# Patient Record
Sex: Female | Born: 1945 | Race: White | Hispanic: No | Marital: Married
Health system: Midwestern US, Community
[De-identification: ages and names within clinical notes are randomized; demographics above are authoritative.]

## PROBLEM LIST (undated history)

## (undated) DIAGNOSIS — Z923 Personal history of irradiation: Secondary | ICD-10-CM

## (undated) HISTORY — PX: BREAST LUMPECTOMY: SHX2

## (undated) HISTORY — PX: BREAST BIOPSY: SHX20

---

## 1997-07-20 HISTORY — PX: BREAST LUMPECTOMY: SHX2

## 1998-03-26 ENCOUNTER — Other Ambulatory Visit: Admission: RE | Admit: 1998-03-26 | Discharge: 1998-03-26 | Payer: Self-pay | Admitting: General Surgery

## 1998-04-02 ENCOUNTER — Ambulatory Visit (HOSPITAL_COMMUNITY): Admission: RE | Admit: 1998-04-02 | Discharge: 1998-04-02 | Payer: Self-pay | Admitting: General Surgery

## 1998-04-02 ENCOUNTER — Encounter: Payer: Self-pay | Admitting: General Surgery

## 1998-05-03 ENCOUNTER — Encounter: Admission: RE | Admit: 1998-05-03 | Discharge: 1998-08-01 | Payer: Self-pay | Admitting: Hematology & Oncology

## 1998-09-20 ENCOUNTER — Ambulatory Visit (HOSPITAL_COMMUNITY): Admission: RE | Admit: 1998-09-20 | Discharge: 1998-09-20 | Payer: Self-pay | Admitting: Surgery

## 1998-11-04 ENCOUNTER — Encounter: Payer: Self-pay | Admitting: Oncology

## 1998-11-04 ENCOUNTER — Ambulatory Visit (HOSPITAL_COMMUNITY): Admission: RE | Admit: 1998-11-04 | Discharge: 1998-11-04 | Payer: Self-pay | Admitting: Oncology

## 1998-11-11 ENCOUNTER — Ambulatory Visit (HOSPITAL_COMMUNITY): Admission: RE | Admit: 1998-11-11 | Discharge: 1998-11-11 | Payer: Self-pay | Admitting: Oncology

## 1998-11-11 ENCOUNTER — Encounter: Payer: Self-pay | Admitting: Oncology

## 1999-05-30 ENCOUNTER — Encounter: Admission: RE | Admit: 1999-05-30 | Discharge: 1999-05-30 | Payer: Self-pay | Admitting: Oncology

## 1999-05-30 ENCOUNTER — Encounter: Payer: Self-pay | Admitting: Oncology

## 1999-11-07 ENCOUNTER — Encounter: Admission: RE | Admit: 1999-11-07 | Discharge: 1999-11-07 | Payer: Self-pay | Admitting: Oncology

## 1999-11-07 ENCOUNTER — Encounter: Payer: Self-pay | Admitting: Oncology

## 1999-11-17 ENCOUNTER — Encounter: Payer: Self-pay | Admitting: Oncology

## 1999-11-17 ENCOUNTER — Encounter: Admission: RE | Admit: 1999-11-17 | Discharge: 1999-11-17 | Payer: Self-pay | Admitting: Oncology

## 1999-11-17 ENCOUNTER — Encounter (INDEPENDENT_AMBULATORY_CARE_PROVIDER_SITE_OTHER): Payer: Self-pay | Admitting: *Deleted

## 1999-11-17 ENCOUNTER — Other Ambulatory Visit: Admission: RE | Admit: 1999-11-17 | Discharge: 1999-11-17 | Payer: Self-pay | Admitting: Oncology

## 2000-05-14 ENCOUNTER — Encounter: Admission: RE | Admit: 2000-05-14 | Discharge: 2000-05-14 | Payer: Self-pay | Admitting: Oncology

## 2000-05-14 ENCOUNTER — Encounter: Payer: Self-pay | Admitting: Oncology

## 2000-10-08 ENCOUNTER — Encounter: Admission: RE | Admit: 2000-10-08 | Discharge: 2000-10-08 | Payer: Self-pay | Admitting: General Surgery

## 2000-10-08 ENCOUNTER — Encounter: Payer: Self-pay | Admitting: General Surgery

## 2001-04-20 ENCOUNTER — Encounter: Payer: Self-pay | Admitting: General Surgery

## 2001-04-20 ENCOUNTER — Encounter: Admission: RE | Admit: 2001-04-20 | Discharge: 2001-04-20 | Payer: Self-pay | Admitting: General Surgery

## 2001-11-11 ENCOUNTER — Encounter: Admission: RE | Admit: 2001-11-11 | Discharge: 2001-11-11 | Payer: Self-pay | Admitting: General Surgery

## 2001-11-11 ENCOUNTER — Encounter: Payer: Self-pay | Admitting: General Surgery

## 2002-11-10 ENCOUNTER — Encounter: Payer: Self-pay | Admitting: Oncology

## 2002-11-10 ENCOUNTER — Encounter: Admission: RE | Admit: 2002-11-10 | Discharge: 2002-11-10 | Payer: Self-pay | Admitting: Oncology

## 2003-11-27 ENCOUNTER — Encounter: Admission: RE | Admit: 2003-11-27 | Discharge: 2003-11-27 | Payer: Self-pay | Admitting: Oncology

## 2004-01-16 ENCOUNTER — Ambulatory Visit (HOSPITAL_COMMUNITY): Admission: RE | Admit: 2004-01-16 | Discharge: 2004-01-16 | Payer: Self-pay | Admitting: Gastroenterology

## 2004-09-16 ENCOUNTER — Ambulatory Visit: Payer: Self-pay | Admitting: Oncology

## 2004-12-03 ENCOUNTER — Encounter: Admission: RE | Admit: 2004-12-03 | Discharge: 2004-12-03 | Payer: Self-pay | Admitting: Oncology

## 2005-03-31 ENCOUNTER — Ambulatory Visit: Payer: Self-pay | Admitting: Oncology

## 2005-09-28 ENCOUNTER — Ambulatory Visit: Payer: Self-pay | Admitting: Oncology

## 2006-01-05 ENCOUNTER — Encounter: Admission: RE | Admit: 2006-01-05 | Discharge: 2006-01-05 | Payer: Self-pay | Admitting: Oncology

## 2006-03-31 ENCOUNTER — Ambulatory Visit: Payer: Self-pay | Admitting: Oncology

## 2006-04-02 LAB — COMPREHENSIVE METABOLIC PANEL WITH GFR
ALT: 57 U/L — ABNORMAL HIGH (ref 0–40)
AST: 36 U/L (ref 0–37)
Albumin: 4.5 g/dL (ref 3.5–5.2)
Alkaline Phosphatase: 58 U/L (ref 39–117)
BUN: 15 mg/dL (ref 6–23)
CO2: 24 meq/L (ref 19–32)
Calcium: 8.7 mg/dL (ref 8.4–10.5)
Chloride: 108 meq/L (ref 96–112)
Creatinine, Ser: 0.91 mg/dL (ref 0.40–1.20)
Glucose, Bld: 135 mg/dL — ABNORMAL HIGH (ref 70–99)
Potassium: 4.1 meq/L (ref 3.5–5.3)
Sodium: 140 meq/L (ref 135–145)
Total Bilirubin: 0.8 mg/dL (ref 0.3–1.2)
Total Protein: 7.1 g/dL (ref 6.0–8.3)

## 2006-04-02 LAB — CBC WITH DIFFERENTIAL/PLATELET
BASO%: 0.4 % (ref 0.0–2.0)
Eosinophils Absolute: 0.2 10*3/uL (ref 0.0–0.5)
HGB: 14.3 g/dL (ref 11.6–15.9)
MONO#: 0.4 10*3/uL (ref 0.1–0.9)
NEUT#: 3.3 10*3/uL (ref 1.5–6.5)

## 2006-09-28 ENCOUNTER — Ambulatory Visit: Payer: Self-pay | Admitting: Oncology

## 2006-10-01 LAB — COMPREHENSIVE METABOLIC PANEL
Albumin: 4.5 g/dL (ref 3.5–5.2)
Alkaline Phosphatase: 64 U/L (ref 39–117)
BUN: 13 mg/dL (ref 6–23)
Calcium: 8.9 mg/dL (ref 8.4–10.5)
Creatinine, Ser: 0.65 mg/dL (ref 0.40–1.20)
Glucose, Bld: 117 mg/dL — ABNORMAL HIGH (ref 70–99)
Potassium: 3.9 mEq/L (ref 3.5–5.3)
Sodium: 141 mEq/L (ref 135–145)
Total Protein: 7 g/dL (ref 6.0–8.3)

## 2006-10-01 LAB — CBC WITH DIFFERENTIAL/PLATELET
EOS%: 4.8 % (ref 0.0–7.0)
HGB: 13.9 g/dL (ref 11.6–15.9)
MCH: 30.8 pg (ref 26.0–34.0)
MCV: 88.4 fL (ref 81.0–101.0)
MONO#: 0.4 10*3/uL (ref 0.1–0.9)
MONO%: 8.4 % (ref 0.0–13.0)
NEUT%: 51 % (ref 39.6–76.8)
Platelets: 145 10*3/uL (ref 145–400)
RBC: 4.49 10*6/uL (ref 3.70–5.32)
RDW: 13.9 % (ref 11.3–14.5)

## 2007-01-07 ENCOUNTER — Encounter: Admission: RE | Admit: 2007-01-07 | Discharge: 2007-01-07 | Payer: Self-pay | Admitting: Oncology

## 2007-03-30 ENCOUNTER — Ambulatory Visit: Payer: Self-pay | Admitting: Oncology

## 2007-04-01 LAB — COMPREHENSIVE METABOLIC PANEL
Albumin: 4.6 g/dL (ref 3.5–5.2)
Alkaline Phosphatase: 61 U/L (ref 39–117)
Calcium: 8.9 mg/dL (ref 8.4–10.5)
Glucose, Bld: 135 mg/dL — ABNORMAL HIGH (ref 70–99)
Sodium: 137 mEq/L (ref 135–145)
Total Protein: 7.2 g/dL (ref 6.0–8.3)

## 2007-04-01 LAB — CBC WITH DIFFERENTIAL/PLATELET
BASO%: 0.4 % (ref 0.0–2.0)
HCT: 40.3 % (ref 34.8–46.6)
HGB: 14.2 g/dL (ref 11.6–15.9)
LYMPH%: 24.3 % (ref 14.0–48.0)
MCH: 31 pg (ref 26.0–34.0)
MONO#: 0.5 10*3/uL (ref 0.1–0.9)
MONO%: 9 % (ref 0.0–13.0)
NEUT%: 62.4 % (ref 39.6–76.8)
RDW: 14 % (ref 11.3–14.5)
WBC: 5.6 10*3/uL (ref 3.9–10.0)

## 2007-09-28 ENCOUNTER — Ambulatory Visit: Payer: Self-pay | Admitting: Oncology

## 2007-09-30 LAB — CBC WITH DIFFERENTIAL/PLATELET
BASO%: 0.7 % (ref 0.0–2.0)
Basophils Absolute: 0 10*3/uL (ref 0.0–0.1)
Eosinophils Absolute: 0.3 10*3/uL (ref 0.0–0.5)
HGB: 14.3 g/dL (ref 11.6–15.9)
LYMPH%: 27.2 % (ref 14.0–48.0)
MCV: 87.3 fL (ref 81.0–101.0)
MONO#: 0.5 10*3/uL (ref 0.1–0.9)
MONO%: 8.5 % (ref 0.0–13.0)
NEUT#: 3.1 10*3/uL (ref 1.5–6.5)
Platelets: 161 10*3/uL (ref 145–400)
WBC: 5.4 10*3/uL (ref 3.9–10.0)
lymph#: 1.5 10*3/uL (ref 0.9–3.3)

## 2007-09-30 LAB — COMPREHENSIVE METABOLIC PANEL
ALT: 37 U/L — ABNORMAL HIGH (ref 0–35)
AST: 27 U/L (ref 0–37)
Albumin: 4.2 g/dL (ref 3.5–5.2)
BUN: 16 mg/dL (ref 6–23)
CO2: 24 mEq/L (ref 19–32)
Calcium: 8.8 mg/dL (ref 8.4–10.5)
Creatinine, Ser: 0.77 mg/dL (ref 0.40–1.20)
Glucose, Bld: 119 mg/dL — ABNORMAL HIGH (ref 70–99)
Potassium: 4 mEq/L (ref 3.5–5.3)
Sodium: 138 mEq/L (ref 135–145)
Total Protein: 7.1 g/dL (ref 6.0–8.3)

## 2007-10-03 LAB — CA 125: CA 125: 18 U/mL (ref 0.0–30.2)

## 2008-01-27 ENCOUNTER — Encounter: Admission: RE | Admit: 2008-01-27 | Discharge: 2008-01-27 | Payer: Self-pay | Admitting: Oncology

## 2008-02-06 ENCOUNTER — Encounter: Admission: RE | Admit: 2008-02-06 | Discharge: 2008-02-06 | Payer: Self-pay | Admitting: Oncology

## 2008-03-28 ENCOUNTER — Ambulatory Visit: Payer: Self-pay | Admitting: Oncology

## 2008-04-02 LAB — CBC WITH DIFFERENTIAL/PLATELET
Basophils Absolute: 0 10*3/uL (ref 0.0–0.1)
HGB: 14.2 g/dL (ref 11.6–15.9)
MCH: 31 pg (ref 26.0–34.0)
MONO#: 0.4 10*3/uL (ref 0.1–0.9)
NEUT#: 3.2 10*3/uL (ref 1.5–6.5)
NEUT%: 64.5 % (ref 39.6–76.8)
Platelets: 122 10*3/uL — ABNORMAL LOW (ref 145–400)
RDW: 13.9 % (ref 11.3–14.5)
WBC: 5 10*3/uL (ref 3.9–10.0)
lymph#: 1.1 10*3/uL (ref 0.9–3.3)

## 2008-04-02 LAB — COMPREHENSIVE METABOLIC PANEL
ALT: 50 U/L — ABNORMAL HIGH (ref 0–35)
Alkaline Phosphatase: 61 U/L (ref 39–117)
Calcium: 9 mg/dL (ref 8.4–10.5)
Creatinine, Ser: 0.65 mg/dL (ref 0.40–1.20)
Total Protein: 6.9 g/dL (ref 6.0–8.3)

## 2008-10-16 ENCOUNTER — Ambulatory Visit: Payer: Self-pay | Admitting: Oncology

## 2008-10-19 LAB — CBC WITH DIFFERENTIAL/PLATELET
Basophils Absolute: 0 10*3/uL (ref 0.0–0.1)
EOS%: 5.9 % (ref 0.0–7.0)
Eosinophils Absolute: 0.3 10*3/uL (ref 0.0–0.5)
HCT: 42.2 % (ref 34.8–46.6)
HGB: 14.6 g/dL (ref 11.6–15.9)
MCH: 30.8 pg (ref 25.1–34.0)
MCHC: 34.5 g/dL (ref 31.5–36.0)
MCV: 89.2 fL (ref 79.5–101.0)
MONO%: 9.8 % (ref 0.0–14.0)
NEUT#: 2.7 10*3/uL (ref 1.5–6.5)
NEUT%: 57.5 % (ref 38.4–76.8)
RBC: 4.73 10*6/uL (ref 3.70–5.45)
lymph#: 1.2 10*3/uL (ref 0.9–3.3)

## 2008-10-19 LAB — COMPREHENSIVE METABOLIC PANEL
ALT: 42 U/L — ABNORMAL HIGH (ref 0–35)
AST: 33 U/L (ref 0–37)
Albumin: 4.5 g/dL (ref 3.5–5.2)
Glucose, Bld: 110 mg/dL — ABNORMAL HIGH (ref 70–99)
Total Protein: 7.2 g/dL (ref 6.0–8.3)

## 2009-02-14 ENCOUNTER — Encounter: Admission: RE | Admit: 2009-02-14 | Discharge: 2009-02-14 | Payer: Self-pay | Admitting: Oncology

## 2009-10-17 ENCOUNTER — Ambulatory Visit: Payer: Self-pay | Admitting: Oncology

## 2009-10-21 LAB — CBC WITH DIFFERENTIAL/PLATELET
BASO%: 0.7 % (ref 0.0–2.0)
EOS%: 5.9 % (ref 0.0–7.0)
HGB: 14.6 g/dL (ref 11.6–15.9)
LYMPH%: 27.5 % (ref 14.0–49.7)
MCH: 30.6 pg (ref 25.1–34.0)
NEUT#: 2.6 10*3/uL (ref 1.5–6.5)
RBC: 4.77 10*6/uL (ref 3.70–5.45)
RDW: 13.7 % (ref 11.2–14.5)
lymph#: 1.3 10*3/uL (ref 0.9–3.3)

## 2009-10-21 LAB — COMPREHENSIVE METABOLIC PANEL
AST: 23 U/L (ref 0–37)
Albumin: 4.5 g/dL (ref 3.5–5.2)
Alkaline Phosphatase: 45 U/L (ref 39–117)
CO2: 22 mEq/L (ref 19–32)
Calcium: 9.6 mg/dL (ref 8.4–10.5)
Chloride: 105 mEq/L (ref 96–112)
Potassium: 4.1 mEq/L (ref 3.5–5.3)

## 2010-03-17 ENCOUNTER — Encounter: Admission: RE | Admit: 2010-03-17 | Discharge: 2010-03-17 | Payer: Self-pay | Admitting: Oncology

## 2010-12-01 ENCOUNTER — Encounter (HOSPITAL_BASED_OUTPATIENT_CLINIC_OR_DEPARTMENT_OTHER): Admitting: Oncology

## 2010-12-01 ENCOUNTER — Other Ambulatory Visit: Payer: Self-pay | Admitting: Oncology

## 2010-12-01 DIAGNOSIS — C50419 Malignant neoplasm of upper-outer quadrant of unspecified female breast: Secondary | ICD-10-CM

## 2010-12-01 DIAGNOSIS — Z853 Personal history of malignant neoplasm of breast: Secondary | ICD-10-CM

## 2010-12-01 DIAGNOSIS — Z170 Estrogen receptor positive status [ER+]: Secondary | ICD-10-CM

## 2010-12-01 DIAGNOSIS — Z8542 Personal history of malignant neoplasm of other parts of uterus: Secondary | ICD-10-CM

## 2010-12-01 DIAGNOSIS — I890 Lymphedema, not elsewhere classified: Secondary | ICD-10-CM

## 2010-12-01 LAB — COMPREHENSIVE METABOLIC PANEL
ALT: 29 U/L (ref 0–35)
Albumin: 4.5 g/dL (ref 3.5–5.2)
Alkaline Phosphatase: 56 U/L (ref 39–117)
CO2: 22 mEq/L (ref 19–32)
Creatinine, Ser: 0.73 mg/dL (ref 0.40–1.20)
Glucose, Bld: 107 mg/dL — ABNORMAL HIGH (ref 70–99)
Potassium: 4.3 mEq/L (ref 3.5–5.3)
Total Protein: 6.8 g/dL (ref 6.0–8.3)

## 2010-12-01 LAB — CBC WITH DIFFERENTIAL/PLATELET
HCT: 40.2 % (ref 34.8–46.6)
HGB: 13.8 g/dL (ref 11.6–15.9)
MCH: 30.1 pg (ref 25.1–34.0)
MCHC: 34.3 g/dL (ref 31.5–36.0)
MONO#: 0.5 10*3/uL (ref 0.1–0.9)
MONO%: 10.5 % (ref 0.0–14.0)
NEUT#: 3 10*3/uL (ref 1.5–6.5)
WBC: 5 10*3/uL (ref 3.9–10.3)

## 2010-12-05 NOTE — Op Note (Signed)
NAME:  Amanda Lopez, Amanda Lopez                        ACCOUNT NO.:  [id number]   MEDICAL RECORD NO.:  [id number]                   PATIENT TYPE:  AMB   LOCATION:  ENDO                                 FACILITY:  [facility]   PHYSICIAN:  Anselmo Rod, M.D.               DATE OF BIRTH:  1945/07/21   DATE OF PROCEDURE:  DATE OF DISCHARGE:                                 OPERATIVE REPORT   DATE OF PROCEDURE:  January 16, 2004.   PROCEDURE PERFORMED:  Screening colonoscopy.   ENDOSCOPIST:  Anselmo Rod, MD.   INSTRUMENT USED:  Olympus video colonoscope.   INDICATIONS FOR PROCEDURE:  A 65 year old white female with a history of  endometrial and breast cancer undergoing screening colonoscopy.  The  patient's last colonoscopy reportedly was 5 years ago.  Rule out colonic  polyps, masses, etc.   PRE-PROCEDURE PREPARATION:  Informed consent was procured from the patient.  The patient was fasted for 8 years prior to the procedure and prepped with a  bottle of magnesium citrate and a gallon of GoLYTELY the night prior to the  procedure.   PRE-PROCEDURE PHYSICAL:  VITAL SIGNS:  The patient had stable vital signs.  NECK:  Supple.  CHEST:  Clear to auscultation.  HEART:  S1 and S2 regular.  ABDOMEN:  Soft with normal bowel sounds.   DESCRIPTION OF PROCEDURE:  The patient was placed in the left lateral  decubitus position, sedated with 60 mcg of Fentanyl and 6 mg of Versed  intravenously.  Once the patient was adequately sedated and maintained on  low-flow oxygen and continuous cardiac monitoring, the Olympus video  colonoscope was advanced from the rectum to the cecum.  The appendiceal  orifice and ileocecal valve were clearly visualized and photographed.  No  masses, polyps, erosions, ulcerations, or diverticula were seen.  There was  some residual stool in the colon.  Multiple washings were done.  Small,  internal hemorrhoids were seen on retroflexion in the rectum.  The patient  tolerated the  procedure well without immediate complications.   IMPRESSION:  Essentially normal colonoscopy up to the cecum except for  small, internal hemorrhoids.   RECOMMENDATIONS:  1. Continue a high-fiber diet with liberal fluid intake.  2. Repeat CRC screening in the next 5 years unless the patient develops any     abnormal symptoms in the interim.  3. Outpatient followup as the need arises in the future.                                               Anselmo Rod, M.D.    JNM/MEDQ  D:  01/16/2004  T:  01/16/2004  Job:  [id number]   cc:   Lennis P. Darrold Span, M.D.  [address] - [facility]  [place]  [place] [address]  Fax: [phone]   Myra Dr. Gwenith Spitz, [place]

## 2011-05-18 ENCOUNTER — Other Ambulatory Visit: Payer: Self-pay | Admitting: Ph.D. Medical Genetics"

## 2011-05-18 DIAGNOSIS — Z1231 Encounter for screening mammogram for malignant neoplasm of breast: Secondary | ICD-10-CM

## 2011-06-04 ENCOUNTER — Ambulatory Visit
Admission: RE | Admit: 2011-06-04 | Discharge: 2011-06-04 | Disposition: A | Discharge status: Home / Self Care | Source: Ambulatory Visit | Attending: Ph.D. Medical Genetics" | Admitting: Ph.D. Medical Genetics"

## 2011-06-04 ENCOUNTER — Ambulatory Visit

## 2011-06-04 DIAGNOSIS — Z1231 Encounter for screening mammogram for malignant neoplasm of breast: Secondary | ICD-10-CM

## 2012-06-24 ENCOUNTER — Other Ambulatory Visit: Payer: Self-pay | Admitting: Obstetrics and Gynecology

## 2012-06-24 DIAGNOSIS — Z1231 Encounter for screening mammogram for malignant neoplasm of breast: Secondary | ICD-10-CM

## 2012-08-08 ENCOUNTER — Ambulatory Visit

## 2012-08-15 ENCOUNTER — Ambulatory Visit
Admission: RE | Admit: 2012-08-15 | Discharge: 2012-08-15 | Disposition: A | Discharge status: Home / Self Care | Payer: Medicare Other | Source: Ambulatory Visit | Attending: Obstetrics and Gynecology | Admitting: Obstetrics and Gynecology

## 2012-08-15 DIAGNOSIS — Z1231 Encounter for screening mammogram for malignant neoplasm of breast: Secondary | ICD-10-CM

## 2013-09-15 ENCOUNTER — Other Ambulatory Visit: Payer: Self-pay

## 2013-09-15 DIAGNOSIS — Z1231 Encounter for screening mammogram for malignant neoplasm of breast: Secondary | ICD-10-CM

## 2013-09-27 ENCOUNTER — Ambulatory Visit
Admission: RE | Admit: 2013-09-27 | Discharge: 2013-09-27 | Disposition: A | Discharge status: Home / Self Care | Payer: Medicare Other | Source: Ambulatory Visit

## 2013-09-27 DIAGNOSIS — Z1231 Encounter for screening mammogram for malignant neoplasm of breast: Secondary | ICD-10-CM

## 2014-11-23 ENCOUNTER — Other Ambulatory Visit: Payer: Self-pay

## 2014-11-23 DIAGNOSIS — Z1231 Encounter for screening mammogram for malignant neoplasm of breast: Secondary | ICD-10-CM

## 2014-12-28 ENCOUNTER — Ambulatory Visit: Payer: Medicare Other

## 2015-01-16 ENCOUNTER — Ambulatory Visit
Admission: RE | Admit: 2015-01-16 | Discharge: 2015-01-16 | Disposition: A | Discharge status: Home / Self Care | Payer: Medicare Other | Source: Ambulatory Visit

## 2015-01-16 DIAGNOSIS — Z1231 Encounter for screening mammogram for malignant neoplasm of breast: Secondary | ICD-10-CM

## 2015-09-27 ENCOUNTER — Telehealth: Payer: Self-pay

## 2015-09-27 NOTE — Telephone Encounter (Deleted)
ENCOUNTER OPENED IN ERROR

## 2015-09-27 NOTE — Telephone Encounter (Signed)
Patient called asking to speak with Barbaraann Share.   Writer tried Marine scientist and got VM.  Patient would like a call back today from Dr. Mariana Kaufman nurse, she states that she has had some breast changes and would like to discuss.

## 2015-09-27 NOTE — Telephone Encounter (Signed)
ENCOUNTER OPENED IN ERROR

## 2015-09-27 NOTE — Telephone Encounter (Signed)
Re decrease in breast size Doubt anything of concern, may be related to weight loss or other as you discussed. Since she is not seen here now, suggest she discuss with PCP  thanks

## 2015-09-27 NOTE — Telephone Encounter (Signed)
Amanda Lopez returned call.   She stated that she is not having any problems with the right breast except that over the last 2 months she has noticed theat the right breast is smaller then the left. It looks like her right breast went from a C cup to a B cup.  She does not know if this is a result of radiation and or her breast becoming less dense with age.  Pt also had a lumpectomy . Told her that this may be the case but would send Dr. Marko Plume a note for her to review. Will let her know  Dr. Mariana Kaufman thoughts after review.

## 2015-09-27 NOTE — Telephone Encounter (Signed)
LM in voice mail regarding Dr. Mariana Kaufman response to discussion concerning decreased beast size as noted below.

## 2015-09-27 NOTE — Telephone Encounter (Signed)
LM in voice mail following up on her call from earlier this morning.

## 2015-12-11 ENCOUNTER — Ambulatory Visit: Payer: Medicare Other | Attending: Internal Medicine | Admitting: Physical Therapy

## 2015-12-11 DIAGNOSIS — I890 Lymphedema, not elsewhere classified: Secondary | ICD-10-CM | POA: Insufficient documentation

## 2015-12-11 NOTE — Therapy (Signed)
[place], [place], [address] Phone: [phone]   Fax:  [phone]  Physical Therapy Evaluation  Patient Details  Name: Amanda Lopez MRN: [id number] Date of Birth: [birth year] Referring Provider: Dr. Kathrin Penner at [facility]  Encounter Date: 12/11/2015      PT End of Session - 12/11/15 2105    Visit Number 1   Number of Visits 5   Date for PT Re-Evaluation 02/09/16   PT Start Time 7425   PT Stop Time 1526   PT Time Calculation (min) 93 min   Activity Tolerance Patient tolerated treatment well   Behavior During Therapy [facility] for tasks assessed/performed      No past medical history on file.  No past surgical history on file.  There were no vitals filed for this visit.       Subjective Assessment - 12/11/15 1356    Subjective "I have what I call 'lymph days' where every 15 minutes I'm in and out of the bathroom and it's clear as clear can be."  No idea why some days are like that.  Reports upper thigh, labia, lower abdominal swelling.  I've never had it in my feet.  Has had therapy previously for this, the last time in 2009.  Has used a Flexitouch machine since then; they don't make the garments for that any more so I need a new machine.   Pertinent History 1999 right breast cancer with lumpectomy and radiation with tamoxifen and femara.  h/o right UE swelling and had manual lymph drainage (upper arm and chest).  Has pain from upper arm to right chest that feels like sunburn; elavil takes care of it.  Wears a compression sleeve when she flies more than two hours.  2001 total abdominal hysterectomy with 35 lymph nodes removed; all negative; this was endometrial cancer.  Surgery was in June and was told by the nurse that she was going to get lymphedema.  She went for manual lymph drainage that October.  Feels her cellulite is less visible when the swelling is greater.  Was put into Jobst  chaps pants at that time, but felt that this wasn't effective; she then went into 20-30 panty hose and has been in those since then.  She wears 30-40 when she flies.  She has tried thigh highs but she balloons up at upper border of those.  She bought a chi machine (approved in [place] for lymphedema) and used that a long time.  2003 had vaginal recurrence of endometrial cancer; had partial vaginectomy and radiation, internal and external.  Has gone back into therapy about four times (?) since then.  Flexitouch has two legs and an arm garment.  Right now she is seeing a gynecology urologist in [place] for stress and urge incontinence, which she says she has had forever; she is working with a pelvic therapist for that.  Had a bulking agent injected into urethra a month ago; may have stim machine placed in the fall.  Has Schumberg's idiopathic purpura, treated with a mousse-type medication.   Patient Stated Goals Get upper thigh, labia, lower abdominal swelling a little more under control.   Currently in Pain? Yes   Pain Score 5    Pain Location Leg   Pain Orientation Right;Left;Upper   Pain Descriptors / Indicators Pressure   Aggravating Factors  nothing   Pain Relieving Factors nothing            [facility] PT  Assessment - 12/11/15 0001    Assessment   Medical Diagnosis LE swelling   Referring Provider [facility]. Kathrin [facility]ner at [facility]   Onset Date/Surgical Date 12/19/99  approx., for hysterectomy with lymph node dissection   Hand Dominance Right   Precautions   Precautions Other (comment)   Precaution Comments cancer precautions   Restrictions   Weight Bearing Restrictions No   Balance Screen   Has the patient fallen in the past 6 months No   Has the patient had a decrease in activity level because of a fear of falling?  No   Is the patient reluctant to leave their home because of a fear of falling?  No   Home Environment   Living Environment Private residence   Living  Arrangements Alone   Type of [place] One level   Prior Function   Level of [place] Retired   Leisure walks the dog every day for about 20 minutes, twice a day  1.5-2 miles each time   Cognition   Overall Cognitive Status Within Functional Limits for tasks assessed   Observation/Other Assessments   Other Surveys  --  lymphedema life impact scale score is 30 (44% impairment)   ROM / Strength   AROM / PROM / Strength --  functional   Ambulation/Gait   Ambulation/Gait Yes   Ambulation/Gait Assistance 7: Independent           LYMPHEDEMA/ONCOLOGY QUESTIONNAIRE - 12/11/15 1423    Type   Cancer Type endometrial with recurrence in vagina  also h/o right breast cancer   Surgeries   Number Lymph Nodes Removed 35  35 in abdomen; 1 sentinel node for right breast cancer   Treatment   Past Radiation Treatment Yes   What other symptoms do you have   Are you having pitting edema Yes   Body Site right thigh--very mild   Do you have infections No   Lymphedema Assessments   Lymphedema Assessments Lower extremities   Right Lower Extremity Lymphedema   20 cm Proximal to Suprapatella 60.8 cm   10 cm Proximal to Suprapatella 51.6 cm   At Midpatella/Popliteal Crease 37.9 cm   30 cm Proximal to Floor at Lateral Plantar Foot 37.9 cm   20 cm Proximal to Floor at Lateral Plantar Foot '29 1   10 ' cm Proximal to Floor at Lateral Malleoli 21.5 cm   5 cm Proximal to 1st MTP Joint 22.2 cm   Around Proximal Great Toe 7.4 cm   Other 98.8 cm. around abdomen at 8 cm. inferior to umbilicus   Left Lower Extremity Lymphedema   20 cm Proximal to Suprapatella 57.7 cm   10 cm Proximal to Suprapatella 49.5 cm   At Midpatella/Popliteal Crease 35.8 cm   30 cm Proximal to Floor at Lateral Plantar Foot 37.1 cm   20 cm Proximal to Floor at Lateral Plantar Foot 30.5 cm   10 cm Proximal to Floor at Lateral Malleoli 21.8 cm   5 cm Proximal to 1st MTP Joint 22.5 cm    Around Proximal Great Toe 7.5 cm                [facility] Adult PT Treatment/Exercise - 12/11/15 0001    Self-Care   Self-Care Other Self-Care Comments   Other Self-Care Comments  Discussed and showed samples of nighttime compression garments. Discussed resources for her to look on the web for info from national lymphedema network and  some vendors.  Discussed the possibility of trying a pair of workout capris or below-knee shorts that provide some compression; she has a pair of shorts she will try first.  Answered patient's questions about kinesiotaping.  Discussed getting pump replaced and gave patient the card of local Flexitouch rep.                PT Education - 12/11/15 2104    Education provided Yes   Education Details about nighttime compression garment options, possibility of trying compression shorts or capris   Person(s) Educated Patient   Methods Explanation;Handout  Scientist, physiological and [employer], [employer]ymp[employer] brochure, Flexitouch rep business card   Comprehension Verbalized understanding                [facility] Goals - 12/11/15 2121    CC Long Term Goal  #1   Title Patient will be knowledgeable about nighttime compression garment options.   Time 8   Period Weeks   Status Partially Met   CC Long Term Goal  #2   Title Patient will have made progress toward replacing her Flexitouch pump.   Time 8   Status New   CC Long Term Goal  #3   Title Patient will have compression shorts, [place] shorts, or capris for abdominal and thigh edema control.   CC Long Term Goal  #4   Title Patient will report at least 25% improvement in terms of knowledge of self-care approaches for lymphedema management.            Plan - 12/11/15 2109    Clinical Impression Statement This is a pleasant woman with a complicated medical history including breast cancer and endometrial cancer; for the latter, she had 35 abdominal lymph nodes removed and complains of  swelling in her thighs and abdomen.  She does have the appearance of very mild pitting edema in right thigh; abdomen does appear bulky.  She wears compression pantyhose.  She has a history of various treatments for lymphedema and has a Flexitouch pump that needs to be replaced .  She has not used compression at night.  She has been bandaged previously, but it sounds like the bandages were only kept on part time.  She wants to get better control of her swelling; this seems like a kind of tune-up, since she is experienced with treatment and some of the equipment available to her.  She also has an unusual complaint of what she considers to be intermittent dumping of lymphatic fluid by her body wherein she will have a signficant increase in diuresis over a couple-hour period intermittently.     Rehab Potential Good   PT Frequency --  probably up to four visits for education and assist with obtaining equipment   PT Treatment/Interventions ADLs/Self Care Home Management;DME Instruction;Therapeutic exercise;Patient/family education;Manual techniques;Manual lymph drainage;Compression bandaging   PT Next Visit Plan Patient is going to try compression shorts and see how she does with wearing those at night.  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  She will then be on vacation for close to two weeks.  She plans to call once she has returned, and will let me know then how she would like to proceed, whether that is to consider a different nighttime compression garments, different compression short or capri, or doing a round of complete decongestive therapy.   Consulted and Agree with Plan of Care Patient  Patient will benefit from skilled therapeutic intervention in order to improve the following deficits and impairments:  Decreased knowledge of precautions, Decreased knowledge of use of DME, Increased edema  Visit Diagnosis: Lymphedema, not elsewhere classified -  Plan: PT plan of care cert/re-cert      G-Codes - 84/41/71 10/10/23    Functional Assessment Tool Used lymphedema life impact scale   Functional Limitation Self care   Self Care Current Status (W7871) At least 40 percent but less than 60 percent impaired, limited or restricted   Self Care Goal Status (U3672) At least 1 percent but less than 20 percent impaired, limited or restricted       Problem List There are no active problems to display for this patient.   [place] 12/11/2015, 9:28 PM  [place], [place], [address] Phone: [phone]   Fax:  [phone]  Name: Amanda Lopez MRN: [id number] Date of Birth: 01/18/1946   Serafina Royals, PT 12/11/2015 9:28 PM

## 2016-01-28 ENCOUNTER — Other Ambulatory Visit: Payer: Self-pay | Admitting: Obstetrics and Gynecology

## 2016-01-28 DIAGNOSIS — Z1231 Encounter for screening mammogram for malignant neoplasm of breast: Secondary | ICD-10-CM

## 2016-02-11 ENCOUNTER — Ambulatory Visit
Admission: RE | Admit: 2016-02-11 | Discharge: 2016-02-11 | Disposition: A | Discharge status: Home / Self Care | Payer: Medicare Other | Source: Ambulatory Visit | Attending: Obstetrics and Gynecology | Admitting: Obstetrics and Gynecology

## 2016-02-11 DIAGNOSIS — Z1231 Encounter for screening mammogram for malignant neoplasm of breast: Secondary | ICD-10-CM

## 2016-05-14 ENCOUNTER — Ambulatory Visit: Payer: Medicare Other | Attending: Internal Medicine | Admitting: Physical Therapy

## 2016-05-14 DIAGNOSIS — I890 Lymphedema, not elsewhere classified: Secondary | ICD-10-CM | POA: Insufficient documentation

## 2016-05-14 NOTE — Therapy (Signed)
[place], [place], [address] Phone: [phone]   Fax:  [phone]  Physical Therapy Evaluation  Patient Details  Name: Amanda Lopez MRN: [id number] Date of Birth: 17-Aug-1945 Referring Provider: Dr. Kathrin Penner  Encounter Date: 05/14/2016      PT End of Session - 05/14/16 1724    Visit Number 1   Number of Visits 2   Date for PT Re-Evaluation 06/11/16   PT Start Time 1350   PT Stop Time 1445   PT Time Calculation (min) 55 min   Activity Tolerance Patient tolerated treatment well   Behavior During Therapy [facility] for tasks assessed/performed      No past medical history on file.  No past surgical history on file.  There were no vitals filed for this visit.       Subjective Assessment - 05/14/16 1353    Subjective When I came in to see Amanda Lopez my complaint was I seemed to be leaking a lot of lymph fluid through my urine. It was clear and I spent almost every 10 min in the bathroom. Amanda Lopez suggested I wear compression panty hose 24/7. I bought several different compression options. I was sleeping in compression shorts during June, July and half of August and I realized I was not going to the bathroom as much. In Sept I started walking 10,000 steps a day and that has helped as well. I have been using a [place] since 2008. I need new garments for this. I am here to get remeasured. I have been wearing compression panty hose during the day 20-30 mm Hg.    Pertinent History 1999 right breast cancer with lumpectomy and radiation with tamoxifen and femara.  h/o right UE swelling and had manual lymph drainage (upper arm and chest).  Has pain from upper arm to right chest that feels like sunburn; elavil takes care of it.  Wears a compression sleeve when she flies more than two hours.  2001 total abdominal hysterectomy with 35 lymph nodes removed; all negative; this was endometrial cancer.  Surgery was in June  and was told by the nurse that she was going to get lymphedema.  She went for manual lymph drainage that October.  Feels her cellulite is less visible when the swelling is greater.  Was put into Jobst chaps pants at that time, but felt that this wasn't effective; she then went into 20-30 panty hose and has been in those since then.  She wears 30-40 when she flies.  She has tried thigh highs but she balloons up at upper border of those.  She bought a chi machine (approved in [place] for lymphedema) and used that a long time.  2003 had vaginal recurrence of endometrial cancer; had partial vaginectomy and radiation, internal and external.  Has gone back into therapy about four times (?) since then.  Flexitouch has two legs and an arm garment.  Right now she is seeing a gynecology urologist in [place] for stress and urge incontinence, which she says she has had forever; she is working with a pelvic therapist for that.  Had a bulking agent injected into urethra a month ago; may have stim machine placed in the fall.  Has Schumberg's idiopathic purpura, treated with a mousse-type medication.   Patient Stated Goals make the lymphedema better   Currently in Pain? Yes   Pain Score 3    Pain Location Leg   Pain Orientation Right;Left;Upper   Pain Descriptors /  Indicators Tightness   Pain Type Chronic pain   Pain Onset More than a month ago   Pain Frequency Constant   Pain Relieving Factors FlexiTouch            [facility] PT Assessment - 05/14/16 0001      Assessment   Medical Diagnosis LE swelling   Referring Provider Dr. Kathrin Penner   Onset Date/Surgical Date 12/19/99  approx., for hysterectomy with lymph node dissection   Hand Dominance Right   Prior Therapy 1x visit in May 2017  at lymphedema clinic     Precautions   Precautions Other (comment)   Precaution Comments cancer precautions     Restrictions   Weight Bearing Restrictions No     Balance Screen   Has the patient fallen in the past 6  months No   Has the patient had a decrease in activity level because of a fear of falling?  No   Is the patient reluctant to leave their home because of a fear of falling?  No     Home Environment   Living Environment Private residence   Living Arrangements Alone   Type of [place] One level     Prior Function   Level of [place] Retired   Leisure pt walks 10,000 steps a day, pt is trying to exercise to an exercise tape daily     Cognition   Overall Cognitive Status Within Functional Limits for tasks assessed     Observation/Other Assessments   Other Surveys  --  lymphedema life impact scale score is 30 (44% impairment)     Ambulation/Gait   Ambulation/Gait Yes   Ambulation/Gait Assistance 7: Independent           LYMPHEDEMA/ONCOLOGY QUESTIONNAIRE - 05/14/16 1408      Type   Cancer Type endometrial with recurrence in vagina  also h/o right breast cancer     Surgeries   Number Lymph Nodes Removed 35  35 in abdomen; 1 sentinel node for right breast cancer     Date Lymphedema/Swelling Started   Date 04/19/00     Treatment   Active Chemotherapy Treatment No   Past Chemotherapy Treatment No   Active Radiation Treatment No   Past Radiation Treatment Yes     What other symptoms do you have   Are you Having Heaviness or Tightness Yes   Are you having Pain Yes   Are you having pitting edema No   Body Site --   Is it Hard or Difficult finding clothes that fit Yes   Do you have infections No     Lymphedema Assessments   Lymphedema Assessments Lower extremities     Right Lower Extremity Lymphedema   20 cm Proximal to Suprapatella 59.5 cm   10 cm Proximal to Suprapatella 52.5 cm   At Midpatella/Popliteal Crease 38.5 cm   30 cm Proximal to Floor at Lateral Plantar Foot 39 cm   20 cm Proximal to Floor at Lateral Plantar Foot 30 1   10  cm Proximal to Floor at Lateral Malleoli 22.5 cm   5 cm Proximal to 1st MTP Joint 20.9  cm   Across MTP Joint 20.8 cm   Around Proximal Great Toe 8 cm   Other 105 cm. around abdomen at 8 cm. inferior to umbilicus     Left Lower Extremity Lymphedema   20 cm Proximal to Suprapatella 58 cm   10 cm Proximal to Suprapatella  50 cm   At Midpatella/Popliteal Crease 38.5 cm   30 cm Proximal to Floor at Lateral Plantar Foot 38 cm   20 cm Proximal to Floor at Lateral Plantar Foot 29.1 cm   10 cm Proximal to Floor at Lateral Malleoli 22.2 cm   5 cm Proximal to 1st MTP Joint 20.3 cm   Across MTP Joint 19.9 cm   Around Proximal Great Toe 7.5 cm                                Long Term Clinic Goals - 05/14/16 1724      CC Long Term Goal  #1   Title Patient will recieve trial of entre compression pump   Time 4   Period Weeks   Status New            Plan - 05/14/16 1725    Clinical Impression Statement Patient has a history of breast cancer and endometrial cancer. She returns today because her garments for her FlexiTouch compression pump are falling apart. She has had them around 10 years. She was able to obtain her compression pump through [employer] and now she has Medicare. She has been unable to use her FlexiTouch as frequently since her garments are falling apart and her abdominal measurements has increased 7 cm since May. Per Medicare guidelines she has to receive a trial of the [place] pump which does not include a trunk component before she can qualify for a FlexiTouch. I expect that her abdominal measurement will increase since this compression pump does not address abdominal swelling and this is the patient's main complaint. Will see patient again in 30 days to remeasure abdominal circumference and assess for changes in circumferential measurements of bilateral LEs and abdomen.   Rehab Potential Good   PT Frequency [place]   PT Duration 4 weeks   PT Treatment/Interventions Manual lymph drainage;ADLs/Self Care Home Management;Vasopneumatic  Device;Compression bandaging   PT Next Visit Plan reassess circumferential measurements especially at abdomen and see if pt has had an increase in abdominal edema with use of Entre   Consulted and Agree with Plan of Care Patient      Patient will benefit from skilled therapeutic intervention in order to improve the following deficits and impairments:  Decreased knowledge of precautions, Decreased knowledge of use of DME, Increased edema  Visit Diagnosis: Lymphedema, not elsewhere classified - Plan: PT plan of care cert/re-cert      G-Codes - [id number]    Functional Assessment Tool Used lymphedema life impact scale   Functional Limitation Other PT primary   Other PT Primary Current Status [id number]) At least 20 percent but less than 40 percent impaired, limited or restricted   Other PT Primary Goal Status [id number]) At least 1 percent but less than 20 percent impaired, limited or restricted       Problem List There are no active problems to display for this patient.   Alexia Freestone 05/14/2016, 5:38 PM  [place], [place], [address] Phone: [phone]   Fax:  [phone]  Name: PINKEY KILLEEN MRN: [id number] Date of Birth: [birth year]  Allyson Sabal, PT 05/14/16 5:39 PM

## 2016-06-15 ENCOUNTER — Ambulatory Visit: Payer: Medicare Other | Attending: Internal Medicine | Admitting: Physical Therapy

## 2016-06-15 DIAGNOSIS — I890 Lymphedema, not elsewhere classified: Secondary | ICD-10-CM

## 2016-06-15 NOTE — Therapy (Signed)
[place], [place], [address] Phone: [phone]   Fax:  [phone]  Physical Therapy Treatment  Patient Details  Name: Amanda Lopez MRN: [id number] Date of Birth: 1946/02/11 Referring Provider: Dr. Kathrin Penner  Encounter Date: 06/15/2016      PT End of Session - 06/15/16 1449    Visit Number 2   Number of Visits 2   Date for PT Re-Evaluation 06/11/16   PT Start Time 1300   PT Stop Time 1345   PT Time Calculation (min) 45 min   Activity Tolerance Patient tolerated treatment well   Behavior During Therapy [facility] for tasks assessed/performed      No past medical history on file.  No past surgical history on file.  There were no vitals filed for this visit.      Subjective Assessment - 06/15/16 1303    Subjective That Entre pump just squeezes your feet and legs.  I think the only thing smaller on me are my feet.  I'm having trouble zipping and snapping my pants.  Says she has been trying to walk 10,000 steps a day and also does an exercise tape several times a week.   Currently in Pain? Yes   Pain Score 6    Pain Location Leg   Pain Orientation Right;Left   Pain Descriptors / Indicators Discomfort;Tightness  "general discomfort, annoying"   Aggravating Factors  nothing   Pain Relieving Factors nothing               LYMPHEDEMA/ONCOLOGY QUESTIONNAIRE - 06/15/16 1307      Right Lower Extremity Lymphedema   20 cm Proximal to Suprapatella 61.4 cm   10 cm Proximal to Suprapatella 54 cm   At Midpatella/Popliteal Crease 39 cm   30 cm Proximal to Floor at Lateral Plantar Foot 38.1 cm   20 cm Proximal to Floor at Lateral Plantar Foot 28.'7 1   10 ' cm Proximal to Floor at Lateral Malleoli 22 cm   5 cm Proximal to 1st MTP Joint 21.6 cm   Across MTP Joint 20.8 cm   Around Proximal Great Toe 7.8 cm   Other 106.3 cm. around abdomen at 8 cm. inferior to umbilicus     Left Lower Extremity  Lymphedema   20 cm Proximal to Suprapatella 60 cm   10 cm Proximal to Suprapatella 51.8 cm   At Midpatella/Popliteal Crease 38 cm   30 cm Proximal to Floor at Lateral Plantar Foot 37.8 cm   20 cm Proximal to Floor at Lateral Plantar Foot 30.1 cm   10 cm Proximal to Floor at Lateral Malleoli 22 cm   5 cm Proximal to 1st MTP Joint 22 cm   Across MTP Joint 20.5 cm   Around Proximal Great Toe 7.5 cm                  [facility] Adult PT Treatment/Exercise - 06/15/16 0001      Self-Care   Self-Care Other Self-Care Comments   Other Self-Care Comments  Patient described pump and what limitations it has had for her.  Discussed use of compression for her upcoming overseas trip to [place]; discussed and set up next steps for Flexitouch pump.     Manual Therapy   Manual Therapy Edema management   Edema Management circumference measurements taken around abdomen and of both legs  [facility] Goals - 06/15/16 1452      CC Long Term Goal  #1   Title Patient will recieve trial of entre compression pump   Status Achieved     CC Long Term Goal  #2   Title Patient will have made progress toward replacing her Flexitouch pump.   Status Partially Met            Plan - 06/15/16 1449    Clinical Impression Statement Patient has had pump and used it for two weeks, finding it uncomfortable (that it squeezes hard, and she worries about whether it could actually injure lymphatic vessels).  She finds she is now having difficulty zipping and fastening her pants as well.  Her measurements have increased at both thighs and around abdomen in the range of 1-2 cm.  She is wearing compression pantyhose when she comes in today, as well as pull-on, elastic waist pants.   Rehab Potential Good   PT Treatment/Interventions Manual lymph drainage;ADLs/Self Care Home Management;Vasopneumatic Device;Compression bandaging   PT Next Visit Plan no plan for follow-up unless  needed   Recommended Other Services have notified Flexitouch rep of status already by email and will fax measurements to him; he says he will work toward getting approval of the [place] and Agree with Plan of Care Patient      Patient will benefit from skilled therapeutic intervention in order to improve the following deficits and impairments:  Decreased knowledge of precautions, Decreased knowledge of use of DME, Increased edema  Visit Diagnosis: Lymphedema, not elsewhere classified     Problem List There are no active problems to display for this patient.   [facility] 06/15/2016, 2:54 PM  [place], [place], [address] Phone: [phone]   Fax:  [phone]  Name: Amanda Lopez MRN: [id number] Date of Birth: 04-Oct-1945  PHYSICAL THERAPY DISCHARGE SUMMARY  Visits from Start of Care: 2  Current functional level related to goals / functional outcomes: Goals met for trial of Entre pump (failed) and knowledge of steps toward Flexitouch pump acquisition.   Remaining deficits: Swelling in legs that has increased at thighs and abdomen and will continue to require management.   Education / Equipment: About obtaining Flexitouch pump. Plan: Patient agrees to discharge.  Patient goals were partially met. Patient is being discharged due to meeting the stated rehab goals.  ?????    Serafina Royals, PT 06/15/16 2:56 PM

## 2017-02-02 ENCOUNTER — Other Ambulatory Visit: Payer: Self-pay

## 2017-02-02 ENCOUNTER — Other Ambulatory Visit: Payer: Self-pay | Admitting: Internal Medicine

## 2017-02-02 DIAGNOSIS — Z1231 Encounter for screening mammogram for malignant neoplasm of breast: Secondary | ICD-10-CM

## 2017-02-12 ENCOUNTER — Ambulatory Visit
Admission: RE | Admit: 2017-02-12 | Discharge: 2017-02-12 | Disposition: A | Discharge status: Home / Self Care | Payer: Medicare Other | Source: Ambulatory Visit

## 2017-02-12 DIAGNOSIS — Z1231 Encounter for screening mammogram for malignant neoplasm of breast: Secondary | ICD-10-CM

## 2018-01-25 ENCOUNTER — Other Ambulatory Visit: Payer: Self-pay | Admitting: Internal Medicine

## 2018-01-25 DIAGNOSIS — Z1231 Encounter for screening mammogram for malignant neoplasm of breast: Secondary | ICD-10-CM

## 2018-02-16 ENCOUNTER — Ambulatory Visit
Admission: RE | Admit: 2018-02-16 | Discharge: 2018-02-16 | Disposition: A | Discharge status: Home / Self Care | Payer: Medicare Other | Source: Ambulatory Visit | Attending: Internal Medicine | Admitting: Internal Medicine

## 2018-02-16 DIAGNOSIS — Z1231 Encounter for screening mammogram for malignant neoplasm of breast: Secondary | ICD-10-CM

## 2019-01-11 ENCOUNTER — Other Ambulatory Visit: Payer: Self-pay | Admitting: Internal Medicine

## 2019-01-11 DIAGNOSIS — Z1231 Encounter for screening mammogram for malignant neoplasm of breast: Secondary | ICD-10-CM

## 2019-02-24 ENCOUNTER — Ambulatory Visit
Admission: RE | Admit: 2019-02-24 | Discharge: 2019-02-24 | Disposition: A | Discharge status: Home / Self Care | Payer: Medicare Other | Source: Ambulatory Visit | Attending: Internal Medicine | Admitting: Internal Medicine

## 2019-02-24 ENCOUNTER — Other Ambulatory Visit: Payer: Self-pay

## 2019-02-24 DIAGNOSIS — Z1231 Encounter for screening mammogram for malignant neoplasm of breast: Secondary | ICD-10-CM

## 2020-02-19 ENCOUNTER — Other Ambulatory Visit: Payer: Self-pay | Admitting: Internal Medicine

## 2020-02-19 DIAGNOSIS — Z1231 Encounter for screening mammogram for malignant neoplasm of breast: Secondary | ICD-10-CM

## 2020-03-08 ENCOUNTER — Other Ambulatory Visit: Payer: Self-pay

## 2020-03-08 ENCOUNTER — Ambulatory Visit
Admission: RE | Admit: 2020-03-08 | Discharge: 2020-03-08 | Disposition: A | Discharge status: Home / Self Care | Payer: Medicare Other | Source: Ambulatory Visit | Attending: Internal Medicine | Admitting: Internal Medicine

## 2020-03-08 DIAGNOSIS — Z1231 Encounter for screening mammogram for malignant neoplasm of breast: Secondary | ICD-10-CM

## 2020-03-12 ENCOUNTER — Other Ambulatory Visit: Payer: Self-pay | Admitting: Internal Medicine

## 2020-03-12 DIAGNOSIS — R928 Other abnormal and inconclusive findings on diagnostic imaging of breast: Secondary | ICD-10-CM

## 2020-03-19 ENCOUNTER — Other Ambulatory Visit: Payer: Self-pay | Admitting: Physician Assistant

## 2020-03-20 ENCOUNTER — Ambulatory Visit
Admission: RE | Admit: 2020-03-20 | Discharge: 2020-03-20 | Disposition: A | Discharge status: Home / Self Care | Payer: Medicare Other | Source: Ambulatory Visit | Attending: Internal Medicine | Admitting: Internal Medicine

## 2020-03-20 ENCOUNTER — Ambulatory Visit: Payer: Medicare Other

## 2020-03-20 ENCOUNTER — Other Ambulatory Visit: Payer: Self-pay

## 2020-03-20 DIAGNOSIS — R928 Other abnormal and inconclusive findings on diagnostic imaging of breast: Secondary | ICD-10-CM

## 2021-03-11 ENCOUNTER — Other Ambulatory Visit: Payer: Self-pay | Admitting: Internal Medicine

## 2021-03-11 DIAGNOSIS — Z1231 Encounter for screening mammogram for malignant neoplasm of breast: Secondary | ICD-10-CM

## 2021-03-31 ENCOUNTER — Other Ambulatory Visit: Payer: Self-pay

## 2021-03-31 ENCOUNTER — Ambulatory Visit
Admission: RE | Admit: 2021-03-31 | Discharge: 2021-03-31 | Disposition: A | Discharge status: Home / Self Care | Payer: Medicare Other | Source: Ambulatory Visit | Attending: Internal Medicine | Admitting: Internal Medicine

## 2021-03-31 DIAGNOSIS — Z1231 Encounter for screening mammogram for malignant neoplasm of breast: Secondary | ICD-10-CM

## 2022-03-24 ENCOUNTER — Other Ambulatory Visit: Payer: Self-pay | Admitting: Internal Medicine

## 2022-03-24 DIAGNOSIS — Z1231 Encounter for screening mammogram for malignant neoplasm of breast: Secondary | ICD-10-CM

## 2022-04-02 ENCOUNTER — Ambulatory Visit
Admission: RE | Admit: 2022-04-02 | Discharge: 2022-04-02 | Disposition: A | Discharge status: Home / Self Care | Payer: Medicare Other | Source: Ambulatory Visit | Attending: Internal Medicine | Admitting: Internal Medicine

## 2022-04-02 DIAGNOSIS — Z1231 Encounter for screening mammogram for malignant neoplasm of breast: Secondary | ICD-10-CM

## 2022-04-02 HISTORY — DX: Personal history of irradiation: Z92.3

## 2022-04-09 IMAGING — MG MM DIGITAL SCREENING BILAT W/ TOMO AND CAD
8 series · 9 of 24 positions shown · non-contrast
Comparison: Previous exam(s).

CLINICAL DATA: Screening.

EXAM:
DIGITAL SCREENING BILATERAL MAMMOGRAM WITH TOMOSYNTHESIS AND CAD
TECHNIQUE: Bilateral screening digital craniocaudal and mediolateral oblique
mammograms were obtained. Bilateral screening digital breast
tomosynthesis was performed. The images were evaluated with
computer-aided detection.

[L CC synth-2D]
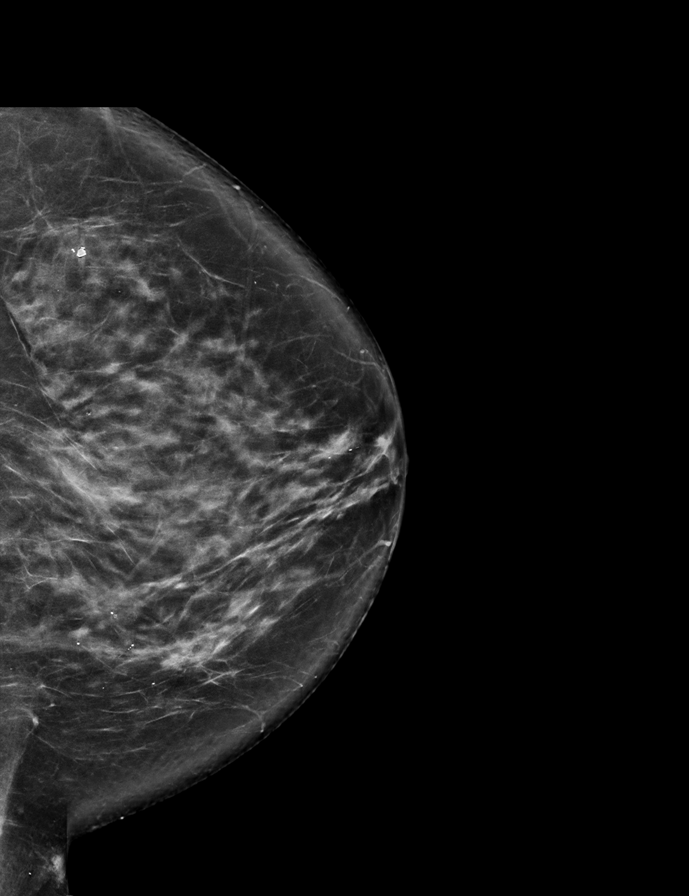

[L MLO synth-2D]
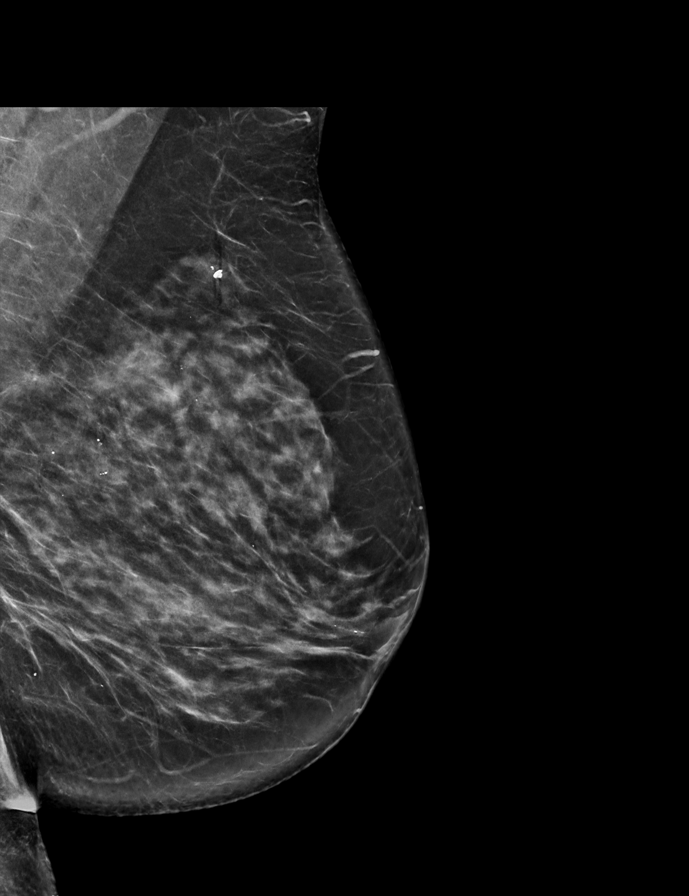

[R CC synth-2D]
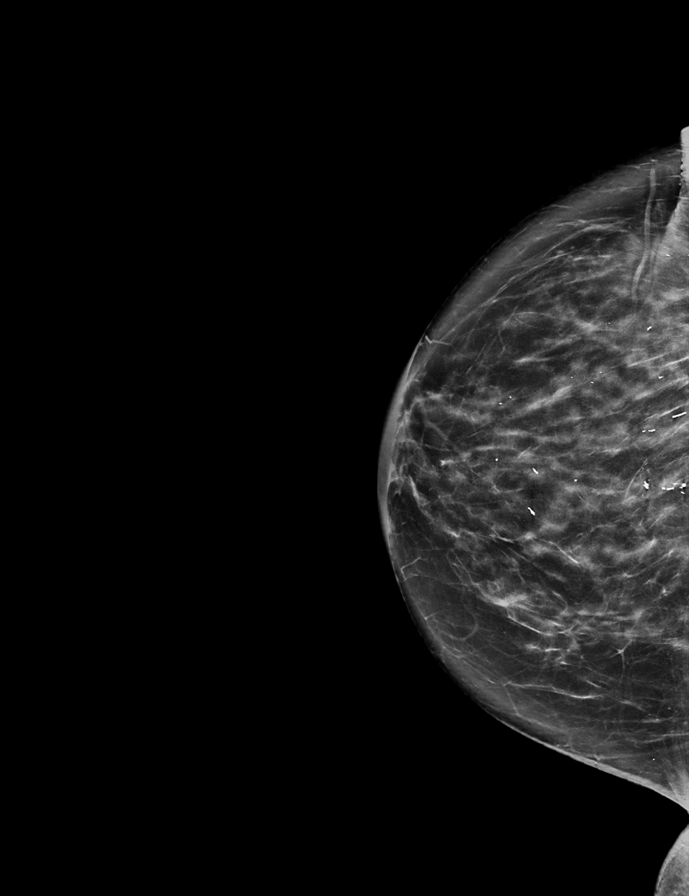

[R MLO synth-2D]
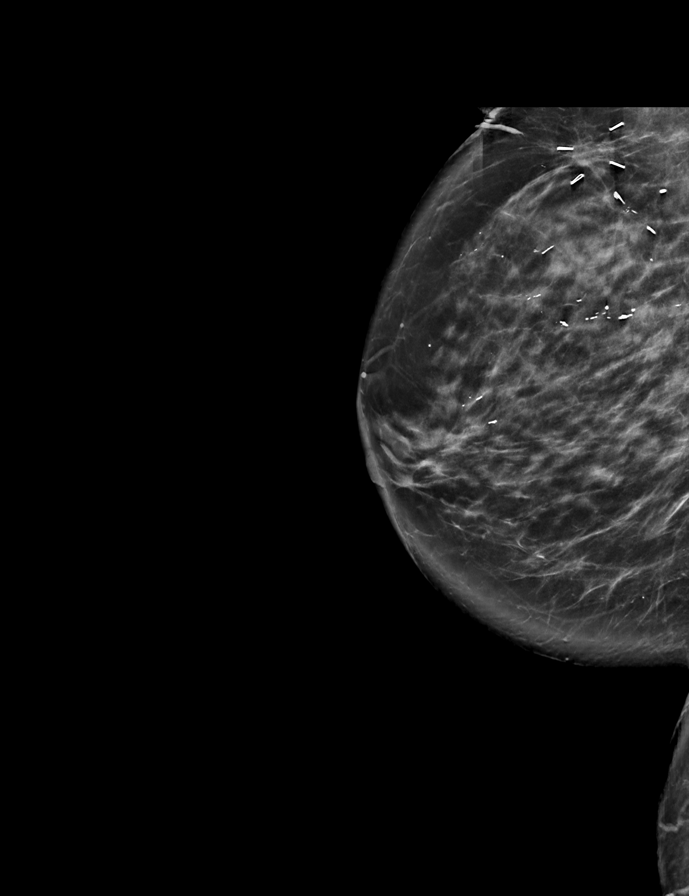

[R MLO tomo · 2 of 74 frames shown]
[frame 24/74]
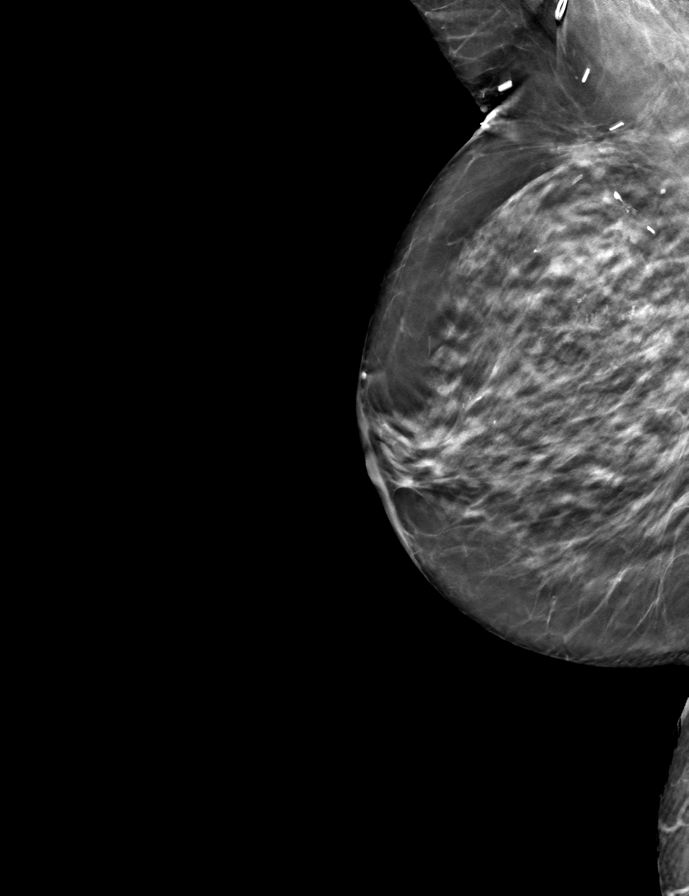
[frame 37/74]
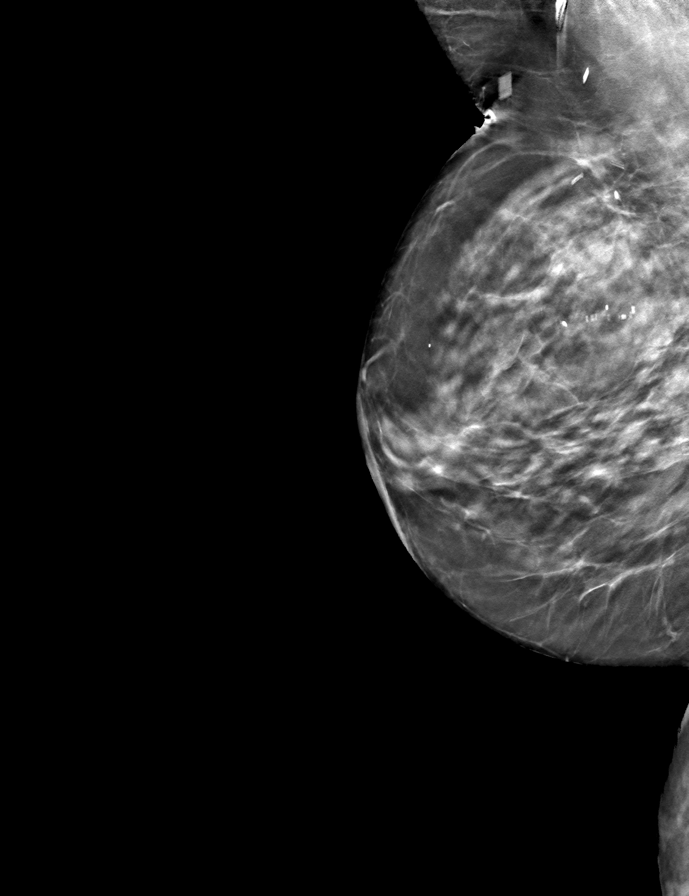

[L MLO tomo · tomo slice 38/75.0]
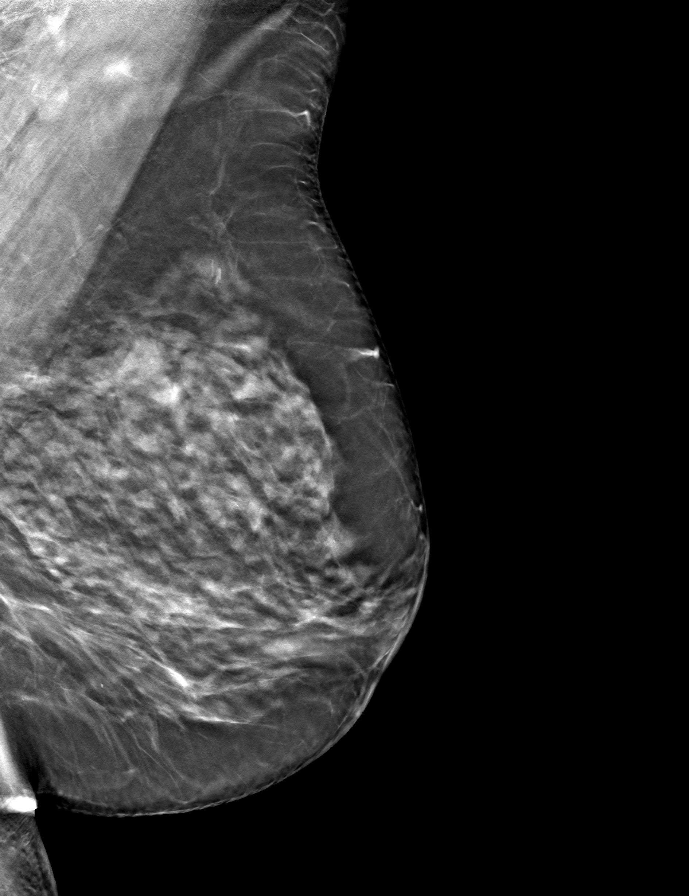

[R CC tomo · tomo slice 35/68.0]
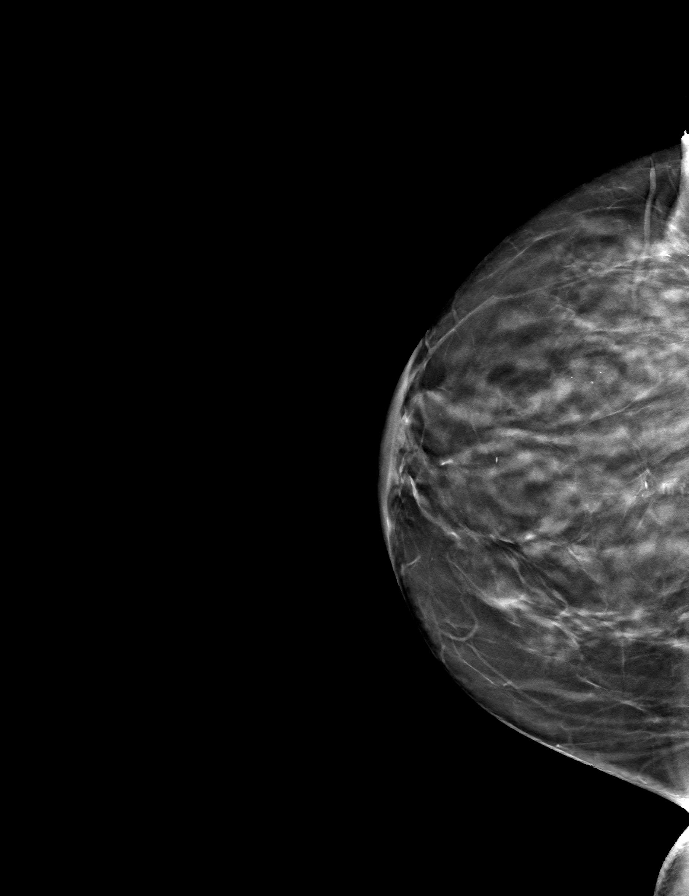

[L CC tomo · tomo slice 39/76.0]
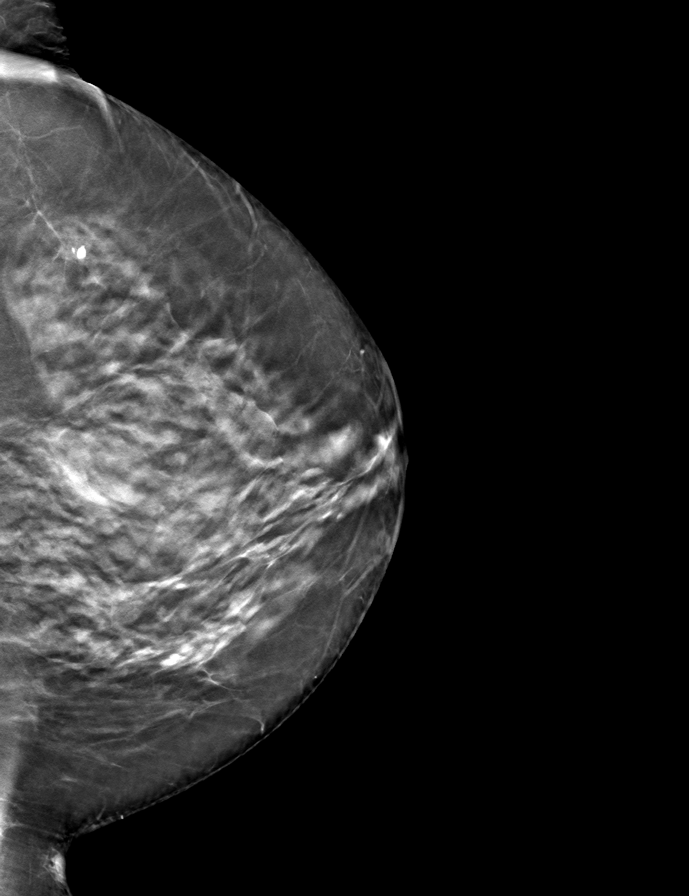

[9 of 24 positions shown; findings below may reference images not displayed]

ACR Breast Density Category c: The breast tissue is heterogeneously
dense, which may obscure small masses.
FINDINGS: There are no findings suspicious for malignancy.
IMPRESSION: No mammographic evidence of malignancy. A result letter of this
screening mammogram will be mailed directly to the patient.

RECOMMENDATION:
Screening mammogram in one year. (Code:Q3-W-BC3)

BI-RADS CATEGORY  1: Negative.

## 2022-07-15 ENCOUNTER — Inpatient Hospital Stay
Admit: 2022-07-15 | Discharge: 2022-07-15 | Discharge status: Against Medical Advice | Payer: MEDICARE | Attending: Student in an Organized Health Care Education/Training Program

## 2022-07-15 ENCOUNTER — Emergency Department: Admit: 2022-07-15 | Payer: MEDICARE

## 2022-07-15 DIAGNOSIS — R079 Chest pain, unspecified: Secondary | ICD-10-CM

## 2022-07-15 LAB — CBC WITH AUTO DIFFERENTIAL
Basophils %: 1 %
Basophils Absolute: 0.1 10*3/uL (ref 0.0–0.2)
Eosinophils %: 3.1 %
Eosinophils Absolute: 0.2 10*3/uL (ref 0.0–0.6)
Hematocrit: 43.1 % (ref 36.0–48.0)
Hemoglobin: 14.6 g/dL (ref 12.0–16.0)
Lymphocytes %: 20.3 %
Lymphocytes Absolute: 1.3 10*3/uL (ref 1.0–5.1)
MCH: 31.4 pg (ref 26.0–34.0)
MCHC: 33.8 g/dL (ref 31.0–36.0)
MCV: 92.9 fL (ref 80.0–100.0)
MPV: 8.5 fL (ref 5.0–10.5)
Monocytes %: 10.6 %
Monocytes Absolute: 0.7 10*3/uL (ref 0.0–1.3)
Neutrophils %: 65 %
Neutrophils Absolute: 4.2 10*3/uL (ref 1.7–7.7)
Platelets: 145 10*3/uL (ref 135–450)
RBC: 4.64 M/uL (ref 4.00–5.20)
RDW: 13.8 % (ref 12.4–15.4)
WBC: 6.5 10*3/uL (ref 4.0–11.0)

## 2022-07-15 LAB — COMPREHENSIVE METABOLIC PANEL W/ REFLEX TO MG FOR LOW K
ALT: 44 U/L — ABNORMAL HIGH (ref 10–40)
AST: 28 U/L (ref 15–37)
Albumin/Globulin Ratio: 1.6 (ref 1.1–2.2)
Albumin: 4.1 g/dL (ref 3.4–5.0)
Alkaline Phosphatase: 63 U/L (ref 40–129)
Anion Gap: 11 (ref 3–16)
BUN: 13 mg/dL (ref 7–20)
CO2: 24 mmol/L (ref 21–32)
Calcium: 9.2 mg/dL (ref 8.3–10.6)
Chloride: 103 mmol/L (ref 99–110)
Creatinine: 0.6 mg/dL (ref 0.6–1.2)
Est, Glom Filt Rate: >60 (ref >60)
Glucose: 162 mg/dL — ABNORMAL HIGH (ref 70–99)
Potassium reflex Magnesium: 4.2 mmol/L (ref 3.5–5.1)
Sodium: 138 mmol/L (ref 136–145)
Total Bilirubin: 0.9 mg/dL (ref 0.0–1.0)
Total Protein: 6.7 g/dL (ref 6.4–8.2)

## 2022-07-15 LAB — TROPONIN
Troponin, High Sensitivity: 8 ng/L (ref 0–14)
Troponin, High Sensitivity: 7 ng/L (ref 0–14)

## 2022-07-15 LAB — D-DIMER, QUANTITATIVE: D-Dimer, Quant: 0.38 ug/mL FEU (ref 0.00–0.60)

## 2022-07-15 MED ORDER — ASPIRIN 325 MG PO TABS
325 MG | Freq: Once | ORAL | Status: AC
Start: 2022-07-15 — End: 2022-07-15
  Administered 2022-07-15: 16:00:00 325 mg via ORAL

## 2022-07-15 MED FILL — ASPIRIN 325 MG PO TABS: 325 MG | ORAL | Qty: 1

## 2022-07-15 NOTE — ED Provider Notes (Signed)
[facility] Emergency Department    CHIEF COMPLAINT  Chest Pain (Pt reports waking up with chest pain around 0300. Denies any cardiac hx. )      SHARED SERVICE VISIT  Evaluated by APP.  My supervising physician was available for consultation.    HISTORY OF PRESENT ILLNESS  Lillymae Duet is a 76 y.o. female who presents to the ED complaining of left-sided chest pressure that began around 3:00 this morning.  Said pain was worse with motion and does get better at rest.  Denies any cardiac history.  Not currently taking any blood thinners.  Denies any headache, body ache, fevers or chills.  Denies any coughing or sneezing.  Denies any sore throat or congestion.  Denies any vision changes or dizziness.  Denies any dyspnea on exertion or shortness of breath.  Denies any nausea, vomiting, or abdominal pain.  Denies any urinary symptoms.  Denies any new onset back pain.  Denies any recent travel or sick contacts.  No other complaints, modifying factors or associated symptoms.     Nursing notes reviewed.   History reviewed. No pertinent past medical history.  History reviewed. No pertinent surgical history.  History reviewed. No pertinent family history.  Social History     Socioeconomic History    Marital status: Married     Spouse name: Not on file    Number of children: Not on file    Years of education: Not on file    Highest education level: Not on file   Occupational History    Not on file   Tobacco Use    Smoking status: Never    Smokeless tobacco: Never   Substance and Sexual Activity    Alcohol use: Not Currently    Drug use: Never    Sexual activity: Not on file   Other Topics Concern    Not on file   Social History Narrative    Not on file     Social Determinants of Health     Financial Resource Strain: Not on file   Food Insecurity: Not on file   Transportation Needs: Not on file   Physical Activity: Not on file   Stress: Not on file   Social Connections: Not on file   Intimate  Partner Violence: Not on file   Housing Stability: Not on file     No current facility-administered medications for this encounter.     Current Outpatient Medications   Medication Sig Dispense Refill    lisinopril (PRINIVIL;ZESTRIL) 5 MG tablet Take 1 tablet by mouth daily       Allergies   Allergen Reactions    Sulfa Antibiotics        REVIEW OF SYSTEMS  10 systems reviewed, pertinent positives per HPI otherwise noted to be negative    PHYSICAL EXAM  BP (!) 140/69   Pulse 59   Temp 98.4 F (36.9 C) (Oral)   Resp 17   Ht 1.499 m (4\' 11" )   Wt 68 kg (150 lb)   SpO2 98%   BMI 30.30 kg/m   GENERAL APPEARANCE: Awake and alert. Cooperative.  No acute distress.  Nontoxic appearance.  HEAD: Normocephalic. Atraumatic.  No battle signs or raccoon eyes.  EYES: PERRL. EOM's grossly intact.  No conjunctival injection or discharge.  No icterus.  ENT: Mucous membranes are pink and moist.   NECK: Supple.  Full range of motion with no neck pain or stiffness.  HEART: RRR. No murmurs, rubs or  gallops.  Normal S1-S2.  No S3 or S4.  No tenderness to palpation of the chest wall.  LUNGS: Respirations unlabored. CTAB. Good air exchange. Speaking comfortably in full sentences.   ABDOMEN: Soft. Non-distended. Non-tender. No guarding or rebound. No masses. No organomegaly.   EXTREMITIES: No peripheral edema. Moves all extremities equally. All extremities neurovascularly intact.   SKIN: Warm and dry. No acute rashes.   NEUROLOGICAL: Alert and oriented. CN's 2-12 intact. No gross facial drooping. Strength 5/5, sensation intact.   PSYCHIATRIC: Normal mood and affect.    RADIOLOGY  XR CHEST (2 VW)    Result Date: 07/15/2022  EXAMINATION: TWO XRAY VIEWS OF THE CHEST 07/15/2022 10:26 am COMPARISON: None. HISTORY: ORDERING SYSTEM PROVIDED HISTORY: CHEST PAIN TECHNOLOGIST PROVIDED HISTORY: Reason for exam:->CHEST PAIN Reason for Exam: chest pain FINDINGS: The heart, mediastinum and pulmonary vascularity are normal.  Lungs are well-expanded  and clear. No skeletal abnormalities are present in the chest.     No significant findings in the chest.       ED COURSE  This 76 year old female presents emergency department for evaluation of left-sided chest pain that began this morning at 3 AM.  Pain did wake him up.  Pain is worsened with exertion and date alleviated with rest.  Denies any other symptoms with this.  Denies any cardiac history.  Not currently taking any blood thinners.  Does take lisinopril for high blood pressure.  Troponin negative x 2.  Normocytic as seen on CBC.  He is hyperglycemic with a glucose of 162.  ALT is elevated to 44.  AST is at 28.  No other abnormality seen on CMP.  D-dimer was negative.  Chest x-ray shows no significant findings in the chest.  EKG was interpreted as normal sinus rhythm.  Does have an elevated heart score and did discuss this with patient.  Would like to admit patient today, however patient is wanting to leave [place].  Patient received 325 mg of aspirin on arrival.  Triage vitals BP 168/89, pulse 78, respirations 20, temperature 98.4 F, SpO2 94% on room air.  Vital signs are in stable progress to be staffed.         Tamiko Leopard wishes to leave the ED at this time against medical advice (AMA). The patient is clinically not intoxicated, free from distracting pain, appears to have intact insight, and in my medical opinion, has the capacity to make decisions.  In this scenario, it would be inappropriate to subject a patient to treatment against their will.     I have voiced my concerns for the patient's health given that a full evaluation and treatment has not occurred during this encounter. I have discussed the need for continued evaluation to determine if their symptoms are caused by a condition that present risk of death or morbidity. Risks including but not limited to death, permanent disability, prolonged hospitalization, prolonged illness, were discussed. I discussed the specific benefits of  additional treatment, as well as tried offering alternative options in hopes that the patient might be amenable to partial evaluation and treatment which would be medically beneficial to the patient. However, the patient declined my options and insisted on leaving.     Because I have been unable to convince the patient to stay, I answered all of their questions about their condition and encouraged them to return to the ED as soon as possible to complete their evaluation, especially if their symptoms worsen or do not improve. I emphasized that  leaving against medical advice does not mean they cannot return here for further evaluation and care. I asked the patient to return if they change their mind about the further evaluation and treatment. I strongly encouraged the patient to return to this Emergency Department or any Emergency Department at any time, particularly with worsening symptoms. They expressed understanding to myself and other staff present in the department today.     Disposition: AMA     CRITICAL CARE TIME  0 Minutes of critical care time spent not including separately billable procedures.    MDM  Results for orders placed or performed during the hospital encounter of 07/15/22   CBC with Auto Differential   Result Value Ref Range    WBC 6.5 4.0 - 11.0 K/uL    RBC 4.64 4.00 - 5.20 M/uL    Hemoglobin 14.6 12.0 - 16.0 g/dL    Hematocrit 00.9 38.1 - 48.0 %    MCV 92.9 80.0 - 100.0 fL    MCH 31.4 26.0 - 34.0 pg    MCHC 33.8 31.0 - 36.0 g/dL    RDW 82.9 93.7 - 16.9 %    Platelets 145 135 - 450 K/uL    MPV 8.5 5.0 - 10.5 fL    Neutrophils % 65.0 %    Lymphocytes % 20.3 %    Monocytes % 10.6 %    Eosinophils % 3.1 %    Basophils % 1.0 %    Neutrophils Absolute 4.2 1.7 - 7.7 K/uL    Lymphocytes Absolute 1.3 1.0 - 5.1 K/uL    Monocytes Absolute 0.7 0.0 - 1.3 K/uL    Eosinophils Absolute 0.2 0.0 - 0.6 K/uL    Basophils Absolute 0.1 0.0 - 0.2 K/uL   Comprehensive Metabolic Panel w/ Reflex to MG   Result Value Ref  Range    Sodium 138 136 - 145 mmol/L    Potassium reflex Magnesium 4.2 3.5 - 5.1 mmol/L    Chloride 103 99 - 110 mmol/L    CO2 24 21 - 32 mmol/L    Anion Gap 11 3 - 16    Glucose 162 (H) 70 - 99 mg/dL    BUN 13 7 - 20 mg/dL    Creatinine 0.6 0.6 - 1.2 mg/dL    Est, Glom Filt Rate >60 >60    Calcium 9.2 8.3 - 10.6 mg/dL    Total Protein 6.7 6.4 - 8.2 g/dL    Albumin 4.1 3.4 - 5.0 g/dL    Albumin/Globulin Ratio 1.6 1.1 - 2.2    Total Bilirubin 0.9 0.0 - 1.0 mg/dL    Alkaline Phosphatase 63 40 - 129 U/L    ALT 44 (H) 10 - 40 U/L    AST 28 15 - 37 U/L   Troponin   Result Value Ref Range    Troponin, High Sensitivity 8 0 - 14 ng/L   Troponin   Result Value Ref Range    Troponin, High Sensitivity 7 0 - 14 ng/L   D-Dimer, Quantitative   Result Value Ref Range    D-Dimer, Quant 0.38 0.00 - 0.60 ug/mL FEU   EKG 12 Lead   Result Value Ref Range    Ventricular Rate 68 BPM    Atrial Rate 68 BPM    P-R Interval 132 ms    QRS Duration 80 ms    Q-T Interval 366 ms    QTc Calculation (Bazett) 389 ms    P Axis 34 degrees    R  Axis 51 degrees    T Axis 7 degrees    Diagnosis       Normal sinus rhythmNormal ECGNo previous ECGs availableConfirmed by Omar PersonBURROUGHS MD, JEFFERSON ([id number]) on 07/16/2022 5:18:40 PM     I estimate there is a MODERATE risk for ACUTE CORONARY SYNDROME, thus I considered decision for admission.  Discussed this with patient who wants to leave AGAINST MEDICAL ADVICE today. I estimate there is LOW risk for PULMONARY EMBOLISM, OR THORACIC AORTIC DISSECTION.  Elmer BalesMarlene Stooksbury and I have discussed the diagnosis and risks, and we disagree with admission.  I would like to admit patient today due to elevated heart score and concerns for acute coronary syndrome, however patient would like to leave AGAINST MEDICAL ADVICE.  He says that he will follow-up with primary care and cardiologist outpatient.    FINAL Impression    1. Chest pain, unspecified type    2. Left against medical advice        Blood pressure (!) 140/69, pulse 59,  temperature 98.4 F (36.9 C), temperature source Oral, resp. rate 17, height 1.499 m (4\' 11" ), weight 68 kg (150 lb), SpO2 98 %.     DISPOSITION  Patient left AGAINST MEDICAL ADVICE.         Colan Neptuneale, Christerpher Clos, PA-C  07/21/22 1451

## 2022-07-15 NOTE — ED Notes (Signed)
Writer reviewed discharge instructions,.  patient verbalized understanding. Patient's IV removed with no complications with dressing in place. Patient stable, ambulatory with steady gait, GCS 15, no signs/ symptoms of acute distress, respirations unlabored, and by self.

## 2022-07-16 LAB — EKG 12-LEAD
Atrial Rate: 68 {beats}/min
P Axis: 34 degrees
P-R Interval: 132 ms
Q-T Interval: 366 ms
QRS Duration: 80 ms
QTc Calculation (Bazett): 389 ms
R Axis: 51 degrees
T Axis: 7 degrees
Ventricular Rate: 68 {beats}/min

## 2023-03-29 ENCOUNTER — Other Ambulatory Visit: Payer: Self-pay | Admitting: Internal Medicine

## 2023-03-29 DIAGNOSIS — Z1231 Encounter for screening mammogram for malignant neoplasm of breast: Secondary | ICD-10-CM

## 2023-04-30 ENCOUNTER — Ambulatory Visit
Admission: RE | Admit: 2023-04-30 | Discharge: 2023-04-30 | Disposition: A | Discharge status: Home / Self Care | Payer: Medicare Other | Source: Ambulatory Visit | Attending: Internal Medicine | Admitting: Internal Medicine

## 2023-04-30 DIAGNOSIS — Z1231 Encounter for screening mammogram for malignant neoplasm of breast: Secondary | ICD-10-CM

## 2024-03-29 ENCOUNTER — Other Ambulatory Visit: Payer: Self-pay | Admitting: Internal Medicine

## 2024-03-29 DIAGNOSIS — Z1231 Encounter for screening mammogram for malignant neoplasm of breast: Secondary | ICD-10-CM

## 2024-05-01 ENCOUNTER — Ambulatory Visit
Admission: RE | Admit: 2024-05-01 | Discharge: 2024-05-01 | Disposition: A | Discharge status: Home / Self Care | Source: Ambulatory Visit | Attending: Internal Medicine | Admitting: Internal Medicine

## 2024-05-01 DIAGNOSIS — Z1231 Encounter for screening mammogram for malignant neoplasm of breast: Secondary | ICD-10-CM
